# Patient Record
Sex: Female | Born: 1955 | Race: Black or African American | Hispanic: No | Marital: Married | Smoking: Never smoker
Health system: Southern US, Community
[De-identification: ages and names within clinical notes are randomized; demographics above are authoritative.]

## PROBLEM LIST (undated history)

## (undated) DIAGNOSIS — I10 Essential (primary) hypertension: Secondary | ICD-10-CM

## (undated) DIAGNOSIS — E559 Vitamin D deficiency, unspecified: Secondary | ICD-10-CM

## (undated) DIAGNOSIS — L80 Vitiligo: Secondary | ICD-10-CM

## (undated) DIAGNOSIS — K22711 Barrett's esophagus with high grade dysplasia: Secondary | ICD-10-CM

## (undated) DIAGNOSIS — K8020 Calculus of gallbladder without cholecystitis without obstruction: Secondary | ICD-10-CM

## (undated) DIAGNOSIS — K219 Gastro-esophageal reflux disease without esophagitis: Secondary | ICD-10-CM

## (undated) DIAGNOSIS — K589 Irritable bowel syndrome without diarrhea: Secondary | ICD-10-CM

## (undated) DIAGNOSIS — E785 Hyperlipidemia, unspecified: Secondary | ICD-10-CM

## (undated) DIAGNOSIS — A599 Trichomoniasis, unspecified: Secondary | ICD-10-CM

## (undated) DIAGNOSIS — G5600 Carpal tunnel syndrome, unspecified upper limb: Secondary | ICD-10-CM

## (undated) DIAGNOSIS — A749 Chlamydial infection, unspecified: Secondary | ICD-10-CM

## (undated) DIAGNOSIS — A6000 Herpesviral infection of urogenital system, unspecified: Secondary | ICD-10-CM

## (undated) DIAGNOSIS — L309 Dermatitis, unspecified: Secondary | ICD-10-CM

## (undated) DIAGNOSIS — E119 Type 2 diabetes mellitus without complications: Secondary | ICD-10-CM

## (undated) HISTORY — PX: BREAST SURGERY: SHX581

## (undated) HISTORY — PX: CHOLECYSTECTOMY: SHX55

## (undated) HISTORY — PX: ABDOMINAL HYSTERECTOMY: SHX81

## (undated) HISTORY — PX: TONSILLECTOMY: SUR1361

---

## 2004-11-19 ENCOUNTER — Ambulatory Visit: Payer: Self-pay | Admitting: Internal Medicine

## 2004-12-09 ENCOUNTER — Ambulatory Visit: Payer: Self-pay | Admitting: Internal Medicine

## 2005-06-30 ENCOUNTER — Emergency Department: Payer: Self-pay | Admitting: Emergency Medicine

## 2005-06-30 ENCOUNTER — Other Ambulatory Visit: Payer: Self-pay

## 2005-07-01 ENCOUNTER — Ambulatory Visit: Payer: Self-pay | Admitting: Emergency Medicine

## 2005-11-06 ENCOUNTER — Ambulatory Visit: Payer: Self-pay | Admitting: Unknown Physician Specialty

## 2006-03-10 ENCOUNTER — Ambulatory Visit: Payer: Self-pay | Admitting: Gastroenterology

## 2008-07-27 ENCOUNTER — Ambulatory Visit: Payer: Self-pay | Admitting: Unknown Physician Specialty

## 2009-05-15 ENCOUNTER — Ambulatory Visit: Payer: Self-pay | Admitting: Nurse Practitioner

## 2009-10-01 ENCOUNTER — Ambulatory Visit: Payer: Self-pay | Admitting: Unknown Physician Specialty

## 2009-11-29 ENCOUNTER — Ambulatory Visit: Payer: Self-pay | Admitting: Gastroenterology

## 2010-06-07 ENCOUNTER — Ambulatory Visit: Payer: Self-pay | Admitting: Internal Medicine

## 2010-07-01 ENCOUNTER — Ambulatory Visit: Payer: Self-pay | Admitting: Family Medicine

## 2010-07-02 ENCOUNTER — Ambulatory Visit: Payer: Self-pay | Admitting: Family Medicine

## 2010-07-31 ENCOUNTER — Ambulatory Visit: Payer: Self-pay | Admitting: Family Medicine

## 2010-08-31 ENCOUNTER — Ambulatory Visit: Payer: Self-pay | Admitting: Family Medicine

## 2010-11-07 ENCOUNTER — Ambulatory Visit: Payer: Self-pay | Admitting: Unknown Physician Specialty

## 2012-04-17 IMAGING — US US PELV - US TRANSVAGINAL
1 series · 17 of 25 positions shown · non-contrast
Comparison: none

REASON FOR EXAM: PELVIC BLOATING NEG ABD US ON October 01, 2009
COMMENTS:

[Series 1: us pelv - us transvaginal · 17 of 41 slices shown]
[im 1/41]
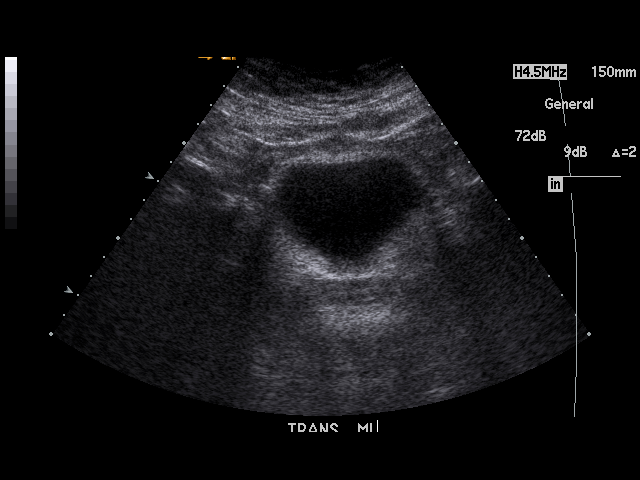
[im 4/41]
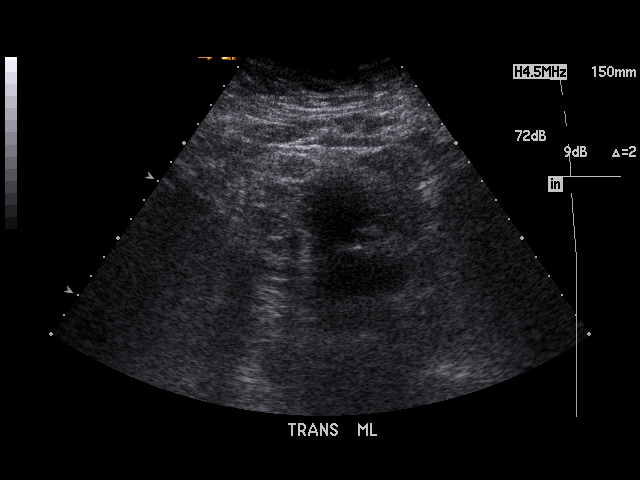
[im 6/41]
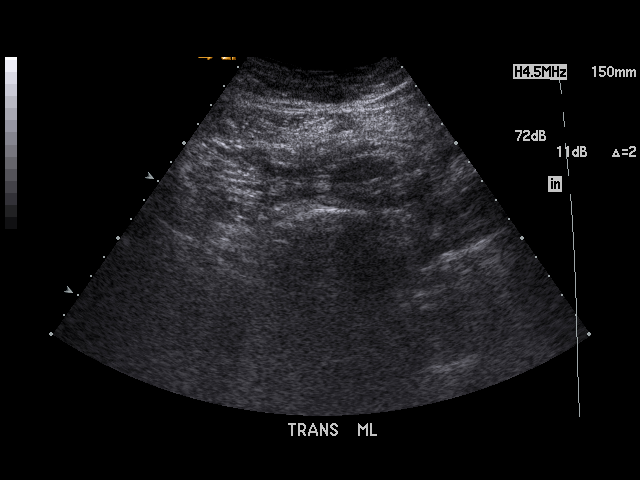
[im 9/41]
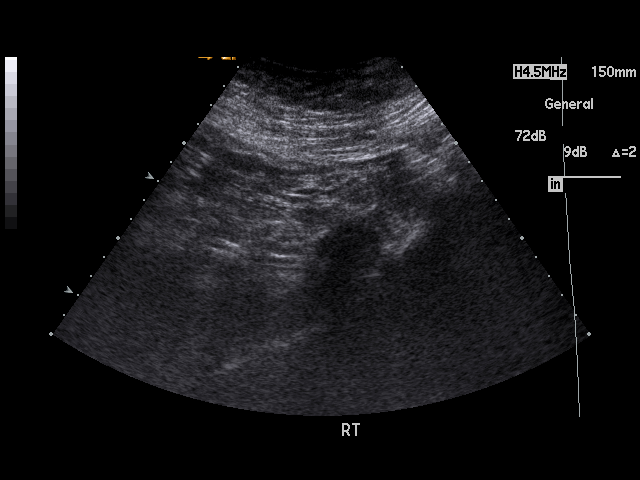
[im 11/41]
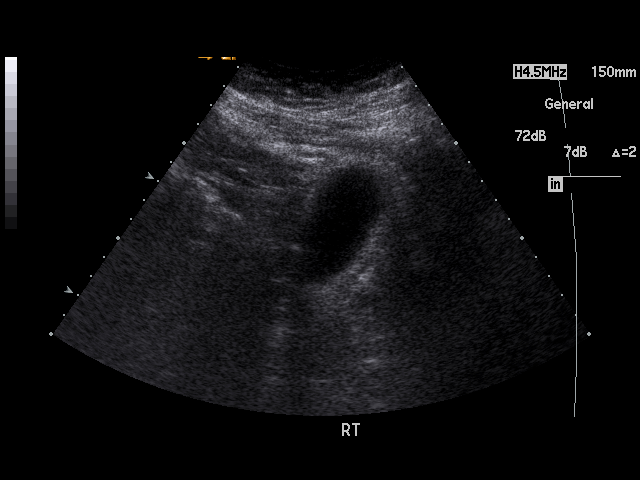
[im 14/41]
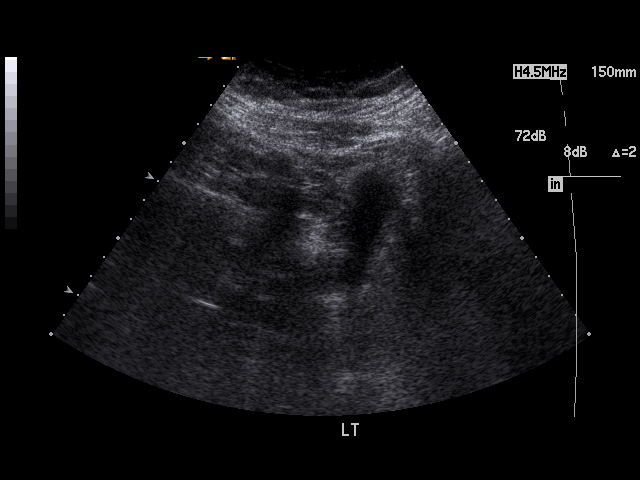
[im 16/41]
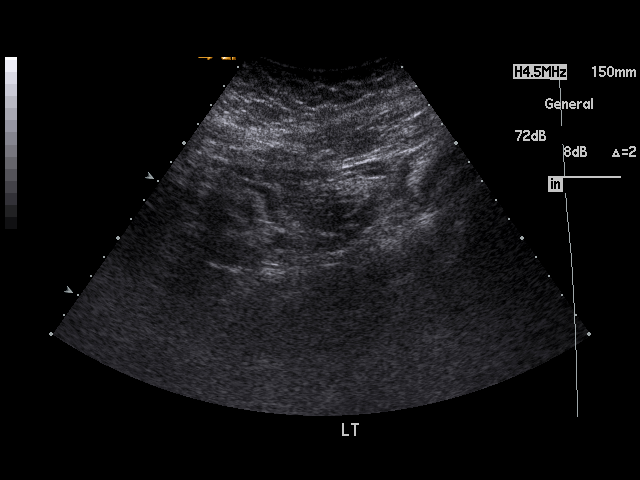
[im 19/41]
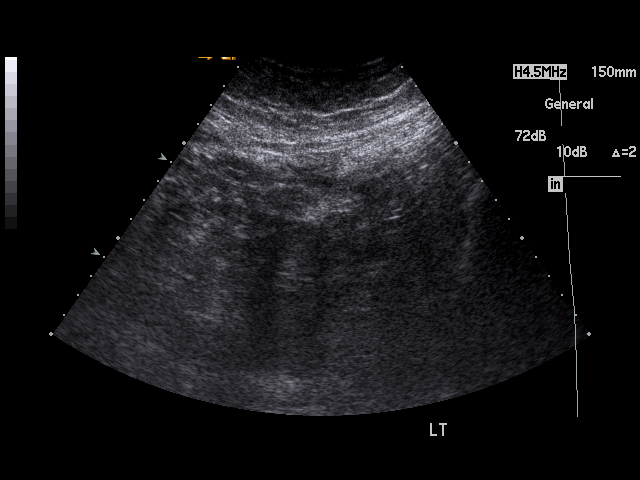
[im 21/41]
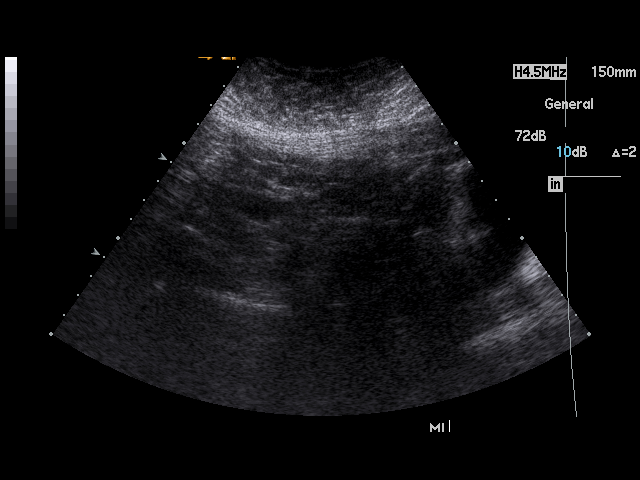
[im 22/41]
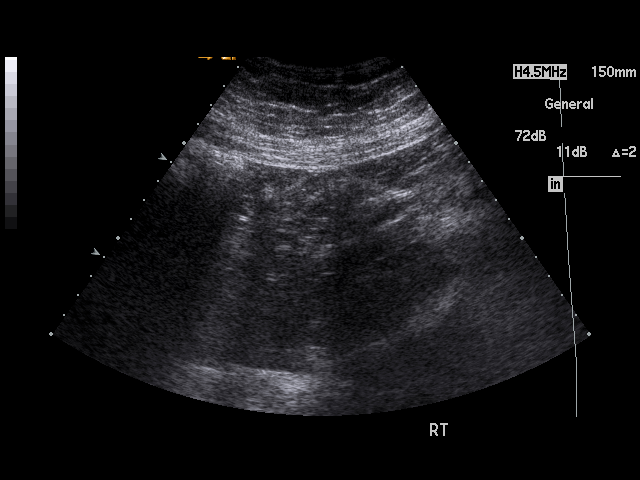
[im 26/41]
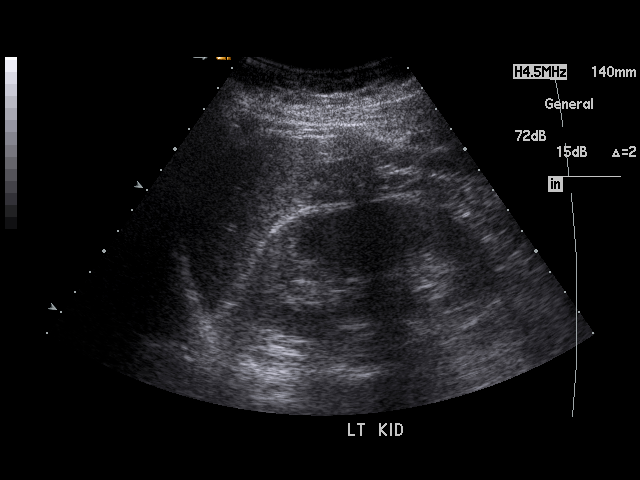
[im 27/41]
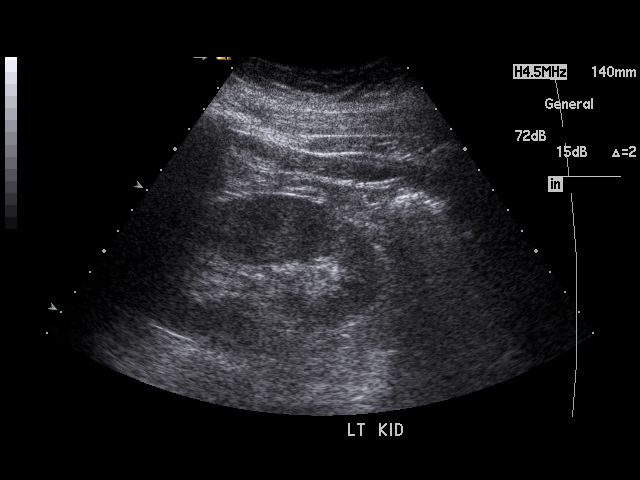
[im 31/41]
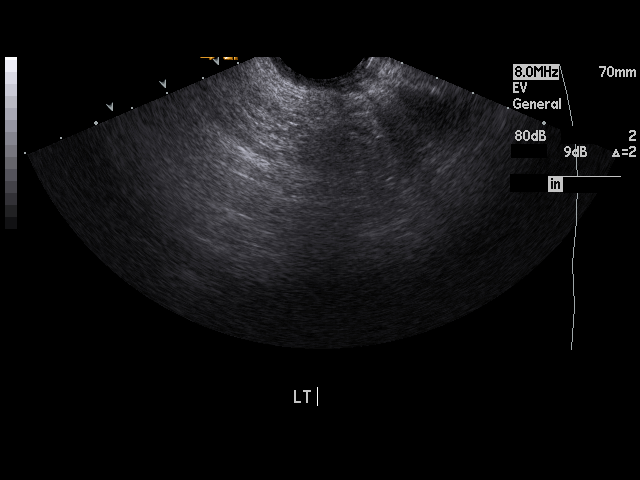
[im 32/41]
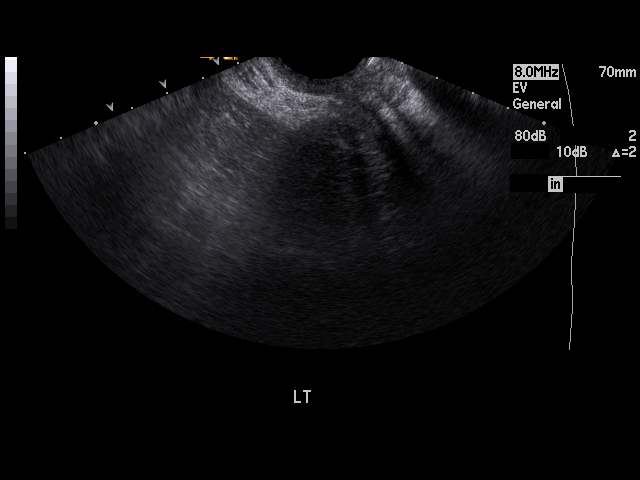
[im 36/41]
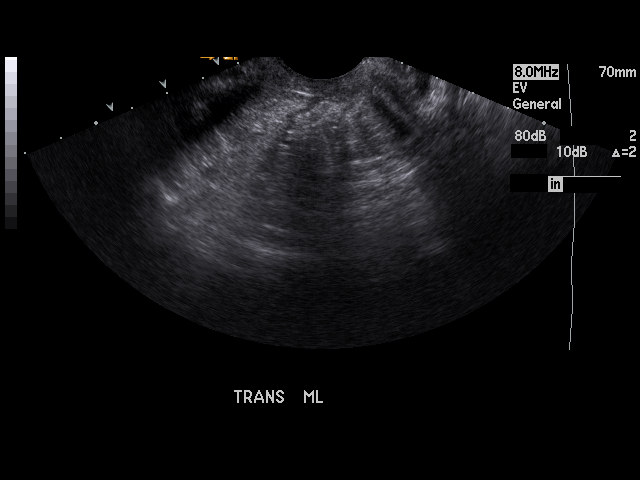
[im 37/41]
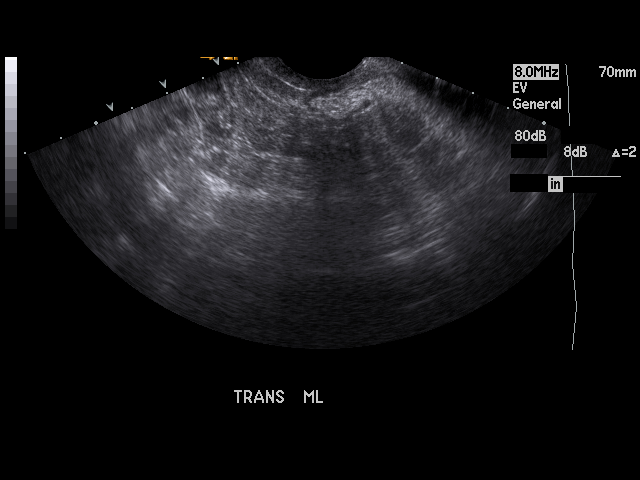
[im 41/41]
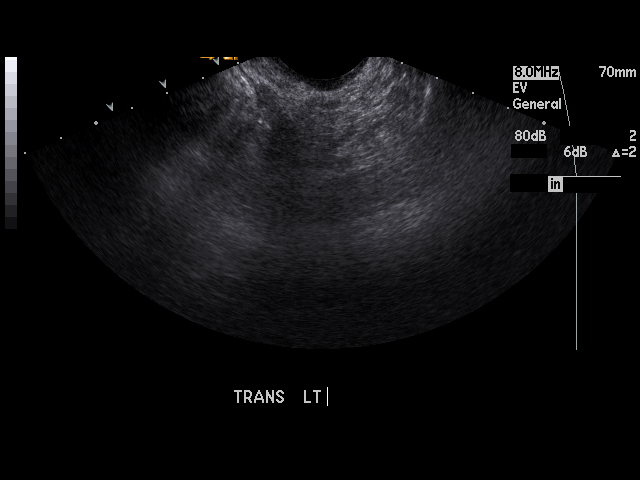

[17 of 25 positions shown; findings below may reference images not displayed]

PROCEDURE:     US  - US PELVIS EXAM W/TRANSVAGINAL  - November 29, 2009  [DATE]

RESULT:     The patient has undergone prior hysterectomy. There is no free
fluid in the pelvis. The ovaries are not identified. No cystic or solid
pelvic masses are identified. Survey views of the kidneys reveal no
hydronephrosis.
IMPRESSION: I do not see abnormalities within the pelvis. The uterus is
surgically absent and the ovaries are not identified.

## 2012-10-24 IMAGING — CT CT HEAD WITHOUT CONTRAST
2 series · 16 of 30 positions shown, 20 images · non-contrast
Comparison: none

REASON FOR EXAM: severe headache hx htn,dm
COMMENTS:

PROCEDURE:     CT  - CT HEAD WITHOUT CONTRAST  - June 07, 2010  [DATE]
RESULT:     History: Headache.
Comparison Study: None.

[Series 2: without · axial · non-contrast · 0.43mm/px · z∈[+199,+319]mm · 13 of 30 slices shown, 17 images]
[im 3/30  brain]
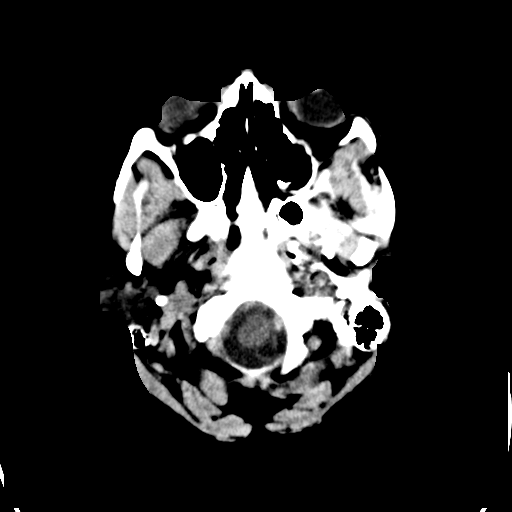
[im 3/30  bone]
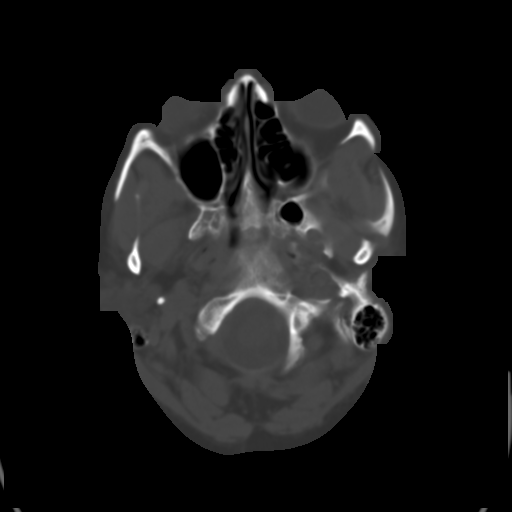
[im 5/30  brain]
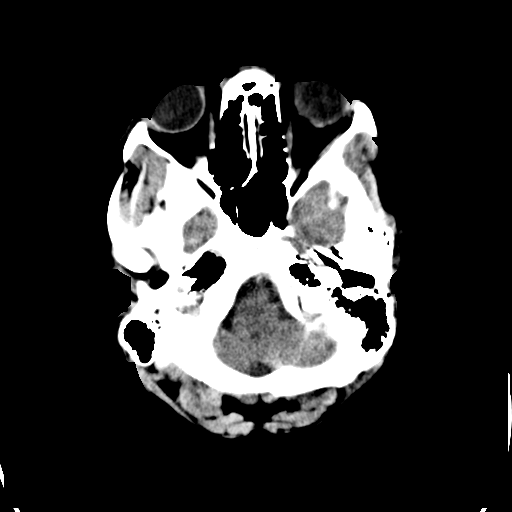
[im 7/30  brain]
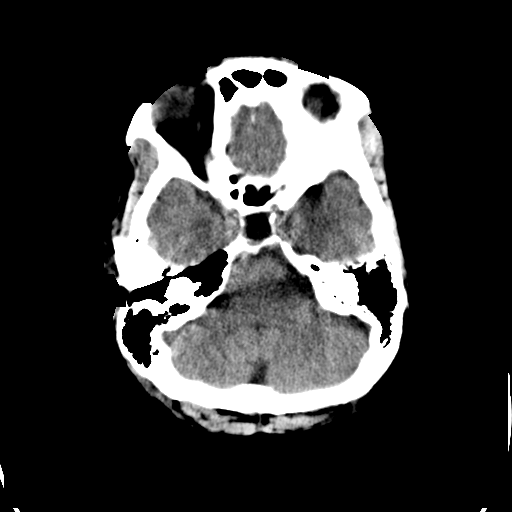
[im 9/30  brain]
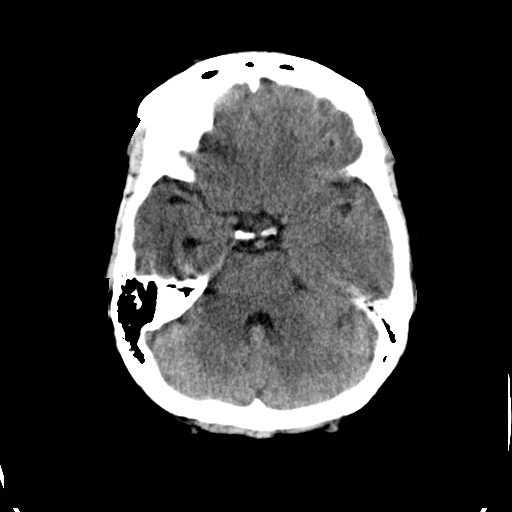
[im 11/30  brain]
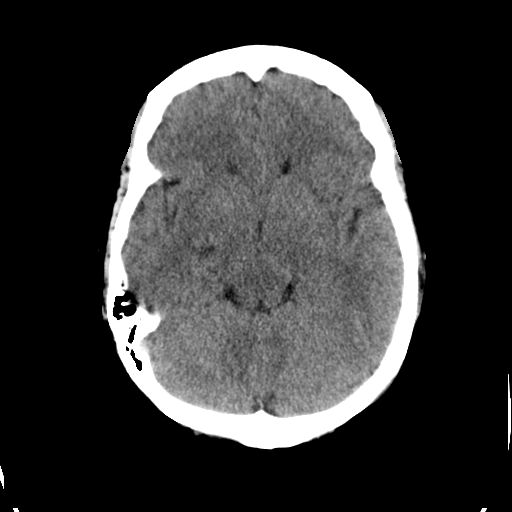
[im 11/30  bone]
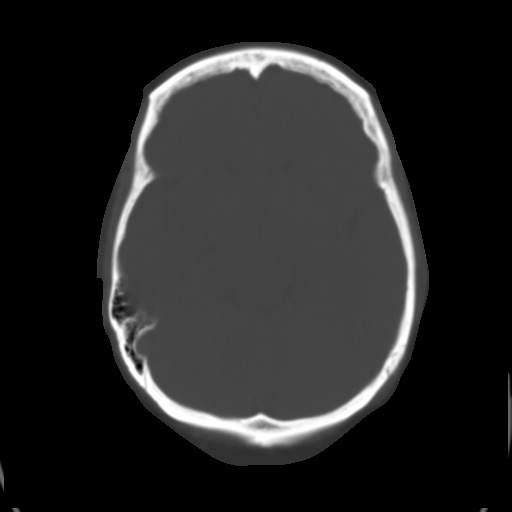
[im 13/30  brain]
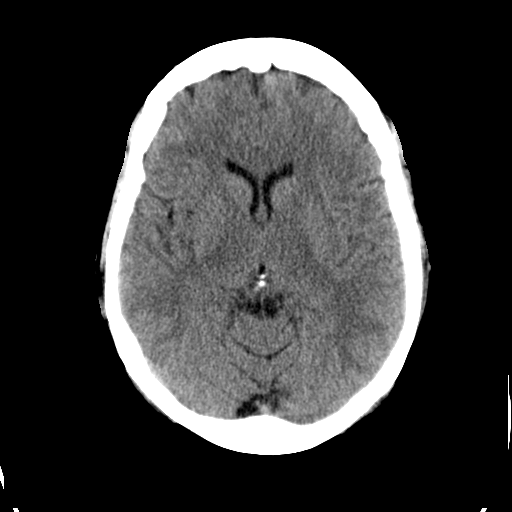
[im 15/30  brain]
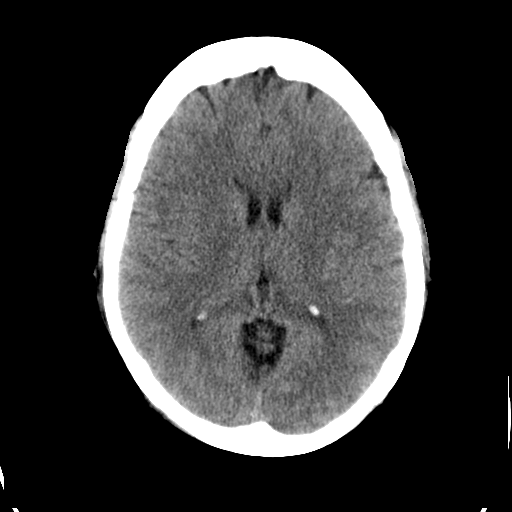
[im 17/30  brain]
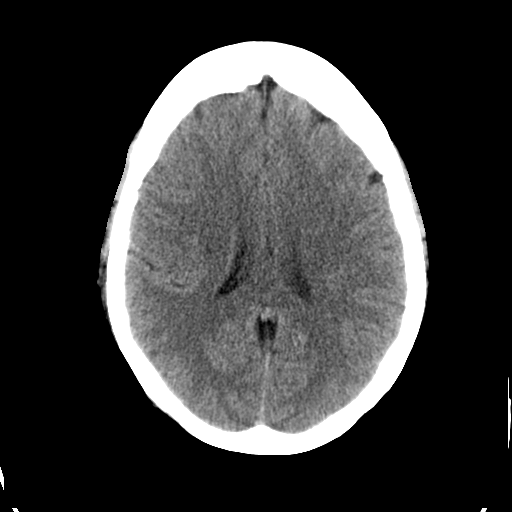
[im 19/30  brain]
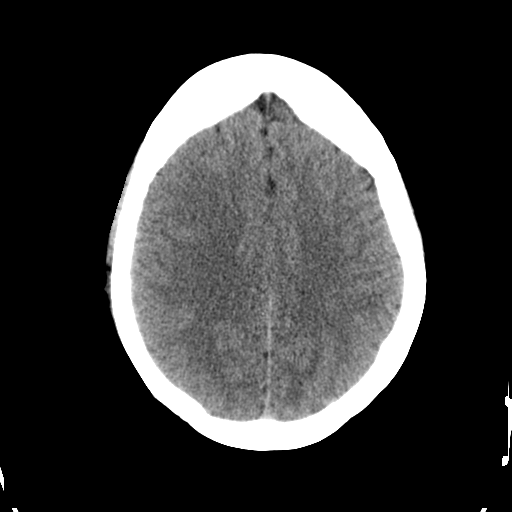
[im 19/30  bone]
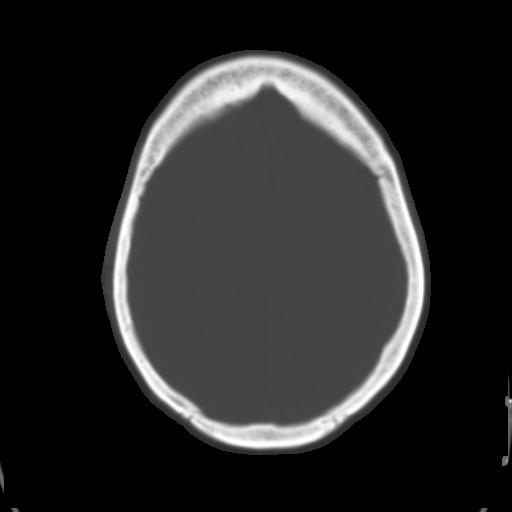
[im 21/30  brain]
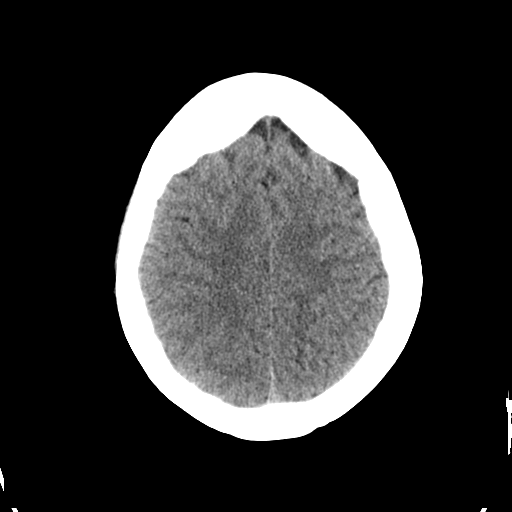
[im 23/30  brain]
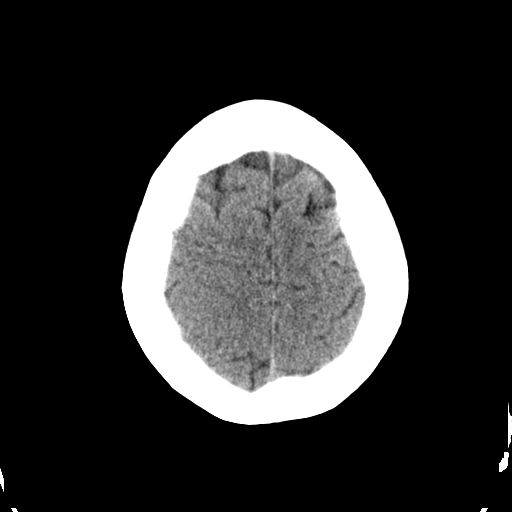
[im 25/30  brain]
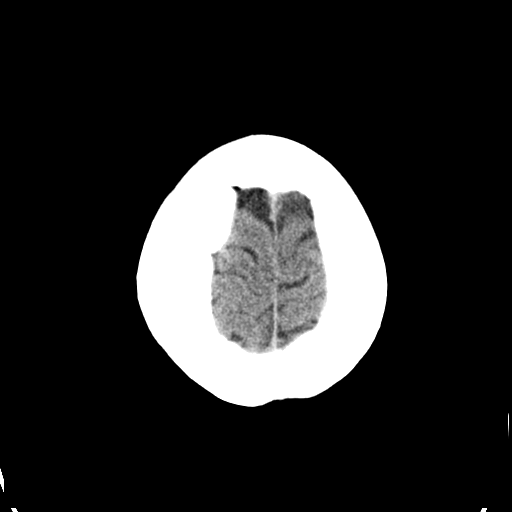
[im 27/30  brain]
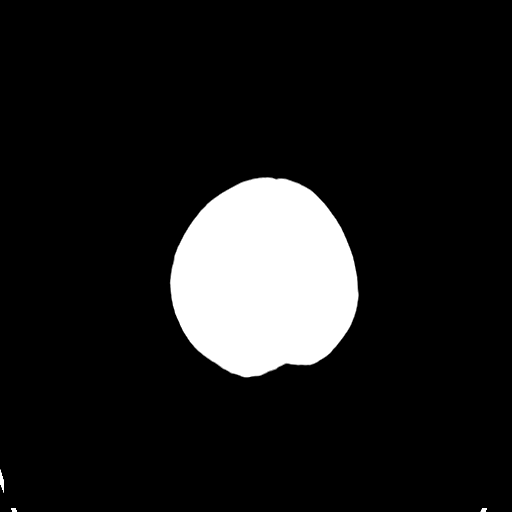
[im 27/30  bone]
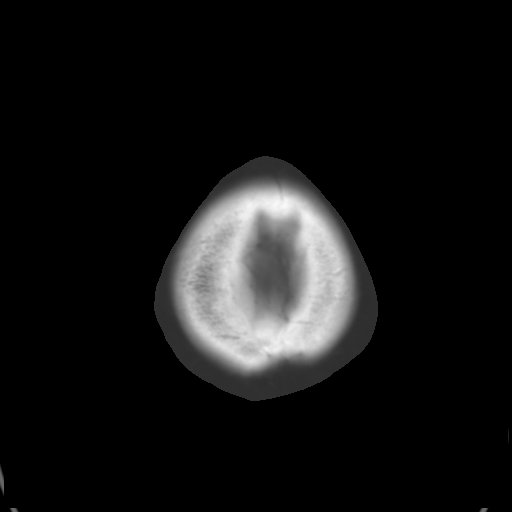

[Series 3: bone · axial · 0.43mm/px · z∈[+199,+239]mm · 3 of 30 slices shown]
[im 3/30  bone]
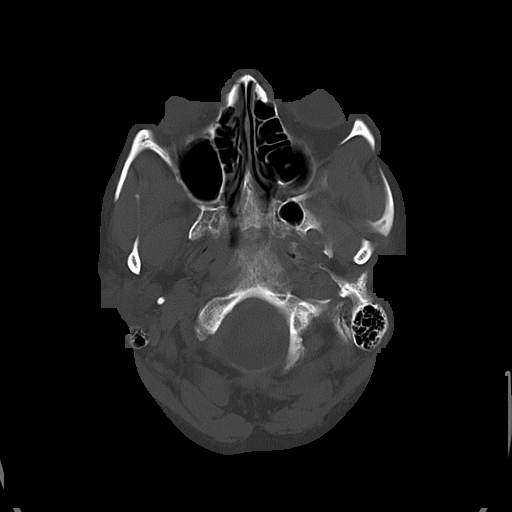
[im 7/30  bone]
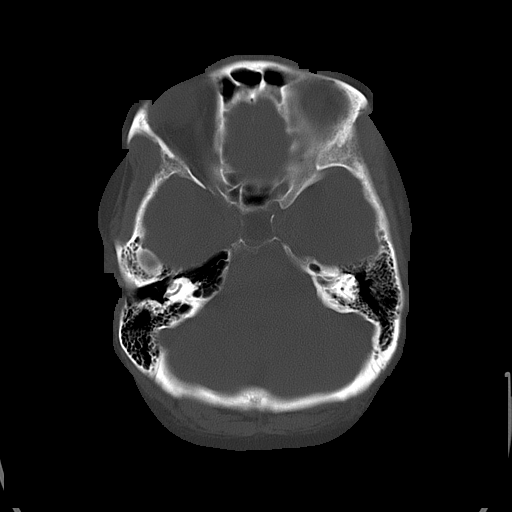
[im 11/30  bone]
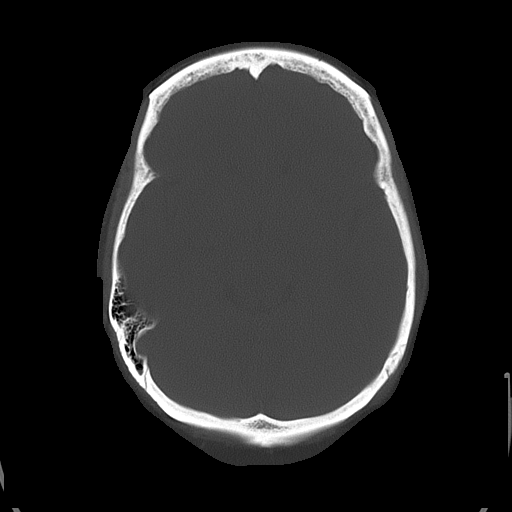

[16 of 30 positions shown; findings below may reference images not displayed]

FINDINGS: No mass. No hydrocephalus. No hemorrhage. No hydrocephalus. No
acute bony abnormality.
IMPRESSION: No acute abnormality.

## 2015-08-09 ENCOUNTER — Other Ambulatory Visit: Payer: Self-pay | Admitting: Nurse Practitioner

## 2015-08-09 DIAGNOSIS — N6320 Unspecified lump in the left breast, unspecified quadrant: Secondary | ICD-10-CM

## 2015-08-14 ENCOUNTER — Ambulatory Visit
Admission: RE | Admit: 2015-08-14 | Discharge: 2015-08-14 | Disposition: A | Discharge status: Home / Self Care | Payer: Managed Care, Other (non HMO) | Source: Ambulatory Visit | Attending: Nurse Practitioner | Admitting: Nurse Practitioner

## 2015-08-14 DIAGNOSIS — N6320 Unspecified lump in the left breast, unspecified quadrant: Secondary | ICD-10-CM

## 2015-08-14 DIAGNOSIS — N63 Unspecified lump in breast: Secondary | ICD-10-CM | POA: Diagnosis present

## 2015-08-15 LAB — SURGICAL PATHOLOGY

## 2015-08-22 ENCOUNTER — Other Ambulatory Visit: Payer: Self-pay | Admitting: Physician Assistant

## 2015-08-22 DIAGNOSIS — R1011 Right upper quadrant pain: Secondary | ICD-10-CM

## 2015-08-28 ENCOUNTER — Ambulatory Visit
Admission: RE | Admit: 2015-08-28 | Discharge: 2015-08-28 | Disposition: A | Discharge status: Home / Self Care | Payer: Managed Care, Other (non HMO) | Source: Ambulatory Visit | Attending: Physician Assistant | Admitting: Physician Assistant

## 2015-08-28 DIAGNOSIS — R1011 Right upper quadrant pain: Secondary | ICD-10-CM | POA: Diagnosis present

## 2015-08-28 DIAGNOSIS — Z9049 Acquired absence of other specified parts of digestive tract: Secondary | ICD-10-CM | POA: Insufficient documentation

## 2015-09-20 ENCOUNTER — Encounter: Payer: Self-pay | Admitting: *Deleted

## 2015-09-23 ENCOUNTER — Ambulatory Visit: Payer: Managed Care, Other (non HMO) | Admitting: Anesthesiology

## 2015-09-23 ENCOUNTER — Encounter
Admission: RE | Disposition: A | Discharge status: Home / Self Care | Payer: Self-pay | Source: Ambulatory Visit | Attending: Unknown Physician Specialty

## 2015-09-23 ENCOUNTER — Ambulatory Visit
Admission: RE | Admit: 2015-09-23 | Discharge: 2015-09-23 | Disposition: A | Discharge status: Home / Self Care | Payer: Managed Care, Other (non HMO) | Source: Ambulatory Visit | Attending: Unknown Physician Specialty | Admitting: Unknown Physician Specialty

## 2015-09-23 DIAGNOSIS — I10 Essential (primary) hypertension: Secondary | ICD-10-CM | POA: Insufficient documentation

## 2015-09-23 DIAGNOSIS — E119 Type 2 diabetes mellitus without complications: Secondary | ICD-10-CM | POA: Insufficient documentation

## 2015-09-23 DIAGNOSIS — D123 Benign neoplasm of transverse colon: Secondary | ICD-10-CM | POA: Insufficient documentation

## 2015-09-23 DIAGNOSIS — K648 Other hemorrhoids: Secondary | ICD-10-CM | POA: Insufficient documentation

## 2015-09-23 DIAGNOSIS — Z79899 Other long term (current) drug therapy: Secondary | ICD-10-CM | POA: Diagnosis not present

## 2015-09-23 DIAGNOSIS — Z7984 Long term (current) use of oral hypoglycemic drugs: Secondary | ICD-10-CM | POA: Diagnosis not present

## 2015-09-23 DIAGNOSIS — K219 Gastro-esophageal reflux disease without esophagitis: Secondary | ICD-10-CM | POA: Insufficient documentation

## 2015-09-23 DIAGNOSIS — Z7982 Long term (current) use of aspirin: Secondary | ICD-10-CM | POA: Diagnosis not present

## 2015-09-23 DIAGNOSIS — Z1211 Encounter for screening for malignant neoplasm of colon: Secondary | ICD-10-CM | POA: Insufficient documentation

## 2015-09-23 DIAGNOSIS — E785 Hyperlipidemia, unspecified: Secondary | ICD-10-CM | POA: Insufficient documentation

## 2015-09-23 DIAGNOSIS — Z8371 Family history of colonic polyps: Secondary | ICD-10-CM | POA: Insufficient documentation

## 2015-09-23 DIAGNOSIS — K5730 Diverticulosis of large intestine without perforation or abscess without bleeding: Secondary | ICD-10-CM | POA: Diagnosis not present

## 2015-09-23 HISTORY — DX: Barrett's esophagus with high grade dysplasia: K22.711

## 2015-09-23 HISTORY — DX: Irritable bowel syndrome, unspecified: K58.9

## 2015-09-23 HISTORY — DX: Trichomoniasis, unspecified: A59.9

## 2015-09-23 HISTORY — DX: Carpal tunnel syndrome, unspecified upper limb: G56.00

## 2015-09-23 HISTORY — DX: Calculus of gallbladder without cholecystitis without obstruction: K80.20

## 2015-09-23 HISTORY — DX: Hyperlipidemia, unspecified: E78.5

## 2015-09-23 HISTORY — DX: Herpesviral infection of urogenital system, unspecified: A60.00

## 2015-09-23 HISTORY — DX: Irritable bowel syndrome without diarrhea: K58.9

## 2015-09-23 HISTORY — DX: Type 2 diabetes mellitus without complications: E11.9

## 2015-09-23 HISTORY — DX: Essential (primary) hypertension: I10

## 2015-09-23 HISTORY — PX: COLONOSCOPY WITH PROPOFOL: SHX5780

## 2015-09-23 HISTORY — DX: Gastro-esophageal reflux disease without esophagitis: K21.9

## 2015-09-23 HISTORY — DX: Vitiligo: L80

## 2015-09-23 HISTORY — DX: Vitamin D deficiency, unspecified: E55.9

## 2015-09-23 HISTORY — DX: Chlamydial infection, unspecified: A74.9

## 2015-09-23 HISTORY — DX: Dermatitis, unspecified: L30.9

## 2015-09-23 LAB — GLUCOSE, CAPILLARY: Glucose-Capillary: 122 mg/dL — ABNORMAL HIGH (ref 65–99)

## 2015-09-23 SURGERY — COLONOSCOPY WITH PROPOFOL
Anesthesia: General

## 2015-09-23 MED ORDER — FENTANYL CITRATE (PF) 100 MCG/2ML IJ SOLN
INTRAMUSCULAR | Status: DC | PRN
  Administered 2015-09-23: 50 ug via INTRAVENOUS

## 2015-09-23 MED ORDER — PROPOFOL 500 MG/50ML IV EMUL
INTRAVENOUS | Status: DC | PRN
  Administered 2015-09-23: 120 ug/kg/min via INTRAVENOUS

## 2015-09-23 MED ORDER — SODIUM CHLORIDE 0.9 % IV SOLN: INTRAVENOUS | Status: DC

## 2015-09-23 MED ORDER — SODIUM CHLORIDE 0.9 % IV SOLN
INTRAVENOUS | Status: DC
  Administered 2015-09-23: 1000 mL via INTRAVENOUS

## 2015-09-23 MED ORDER — EPHEDRINE SULFATE 50 MG/ML IJ SOLN
INTRAMUSCULAR | Status: DC | PRN
  Administered 2015-09-23: 5 mg via INTRAVENOUS

## 2015-09-23 MED ORDER — MIDAZOLAM HCL 2 MG/2ML IJ SOLN
INTRAMUSCULAR | Status: DC | PRN
  Administered 2015-09-23: 1 mg via INTRAVENOUS

## 2015-09-23 NOTE — Anesthesia Preprocedure Evaluation (Signed)
Anesthesia Evaluation  Patient identified by MRN, date of birth, ID band Patient awake    Reviewed: Allergy & Precautions, H&P , NPO status , Patient's Chart, lab work & pertinent test results  History of Anesthesia Complications Negative for: history of anesthetic complications  Airway Mallampati: III  TM Distance: >3 FB Neck ROM: limited    Dental  (+) Poor Dentition   Pulmonary neg pulmonary ROS, neg shortness of breath,    Pulmonary exam normal breath sounds clear to auscultation       Cardiovascular Exercise Tolerance: Good hypertension, (-) angina(-) Past MI and (-) DOE Normal cardiovascular exam Rhythm:regular Rate:Normal     Neuro/Psych  Neuromuscular disease negative psych ROS   GI/Hepatic negative GI ROS, Neg liver ROS, GERD  Controlled,  Endo/Other  diabetes, Type 2, Oral Hypoglycemic Agents  Renal/GU negative Renal ROS  negative genitourinary   Musculoskeletal   Abdominal   Peds  Hematology negative hematology ROS (+)   Anesthesia Other Findings Past Medical History:   Diabetes mellitus without complication (HCC)                 High grade dysplasia of Barrett's epithelium                 Carpal tunnel syndrome                                       Chlamydia                                                    Eczema                                                       Gall stones                                                  Genital herpes                                               GERD (gastroesophageal reflux disease)                       Hyperlipidemia                                               Hypertension                                                 IBS (irritable bowel syndrome)  Vitamin D deficiency                                         Vitiligo                                                     Trichimoniasis                                               Past Surgical History:   ABDOMINAL HYSTERECTOMY                                        CHOLECYSTECTOMY                                               TONSILLECTOMY                                                 BREAST SURGERY                                               BMI    Body Mass Index   34.59 kg/m 2      Reproductive/Obstetrics negative OB ROS                             Anesthesia Physical Anesthesia Plan  ASA: III  Anesthesia Plan: General   Post-op Pain Management:    Induction:   Airway Management Planned:   Additional Equipment:   Intra-op Plan:   Post-operative Plan:   Informed Consent: I have reviewed the patients History and Physical, chart, labs and discussed the procedure including the risks, benefits and alternatives for the proposed anesthesia with the patient or authorized representative who has indicated his/her understanding and acceptance.   Dental Advisory Given  Plan Discussed with: Anesthesiologist, CRNA and Surgeon  Anesthesia Plan Comments:         Anesthesia Quick Evaluation

## 2015-09-23 NOTE — Anesthesia Postprocedure Evaluation (Signed)
Anesthesia Post Note  Patient: Sandra Livingston  Procedure(s) Performed: Procedure(s) (LRB): COLONOSCOPY WITH PROPOFOL (N/A)  Patient location during evaluation: Endoscopy Anesthesia Type: General Level of consciousness: awake and alert Pain management: pain level controlled Vital Signs Assessment: post-procedure vital signs reviewed and stable Respiratory status: spontaneous breathing, nonlabored ventilation, respiratory function stable and patient connected to nasal cannula oxygen Cardiovascular status: blood pressure returned to baseline and stable Postop Assessment: no signs of nausea or vomiting Anesthetic complications: no    Last Vitals:  Filed Vitals:   09/23/15 0902 09/23/15 0910  BP:  107/75  Pulse: 79 77  Temp:    Resp: 23 17    Last Pain: There were no vitals filed for this visit.               Precious Haws Eathen Budreau

## 2015-09-23 NOTE — Op Note (Signed)
[facility] Gastroenterology Patient Name: Sandra Livingston Procedure Date: 09/23/2015 8:21 AM MRN: [id number] Account #: [id number] Date of Birth: [birth year] Admit Type: Outpatient Age: 60 Room: [facility] ENDO ROOM 1 Gender: Female Note Status: Finalized Procedure:            Colonoscopy Indications:          Colon cancer screening in patient at increased risk:                        Family history of 1st-degree relative with colon polyps Providers:            Manya Silvas, MD Referring MD:         Juluis Rainier (Referring MD) Medicines:            Propofol per Anesthesia Complications:        No immediate complications. Procedure:            Pre-Anesthesia Assessment:                       - After reviewing the risks and benefits, the patient                        was deemed in satisfactory condition to undergo the                        procedure.                       After obtaining informed consent, the colonoscope was                        passed under direct vision. Throughout the procedure,                        the patient's blood pressure, pulse, and oxygen                        saturations were monitored continuously. The                        Colonoscope was introduced through the anus and                        advanced to the the cecum, identified by appendiceal                        orifice and ileocecal valve. The colonoscopy was                        performed without difficulty. The patient tolerated the                        procedure well. The quality of the bowel preparation                        was good. Findings:      Many medium-mouthed diverticula were found in the sigmoid colon,       descending colon and transverse colon.      A diminutive polyp was found in the transverse colon. The polyp was  sessile. The polyp was removed with a jumbo cold forceps. Resection and       retrieval were complete.      Internal hemorrhoids  were found during endoscopy. The hemorrhoids were       medium-sized and Grade I (internal hemorrhoids that do not prolapse). Impression:           - Diverticulosis in the sigmoid colon, in the                        descending colon and in the transverse colon.                       - One diminutive polyp in the transverse colon, removed                        with a jumbo cold forceps. Resected and retrieved.                       - Internal hemorrhoids. Recommendation:       - Await pathology results. Manya Silvas, MD 09/23/2015 8:42:11 AM This report has been signed electronically. Number of Addenda: 0 Note Initiated On: 09/23/2015 8:21 AM Scope Withdrawal Time: 0 hours 6 minutes 13 seconds  Total Procedure Duration: 0 hours 10 minutes 47 seconds       [facility]

## 2015-09-23 NOTE — H&P (Signed)
   Primary Care Physician:  Sallee Lange, NP Primary Gastroenterologist:  Dr. Vira Agar  Pre-Procedure History & Physical: HPI:  Sandra Livingston is a 60 y.o. female is here for an colonoscopy.   Past Medical History  Diagnosis Date  . Diabetes mellitus without complication ([place])   . High grade dysplasia of Barrett's epithelium   . Carpal tunnel syndrome   . Chlamydia   . Eczema   . Gall stones   . Genital herpes   . GERD (gastroesophageal reflux disease)   . Hyperlipidemia   . Hypertension   . IBS (irritable bowel syndrome)   . Vitamin D deficiency   . Vitiligo   . Trichimoniasis     Past Surgical History  Procedure Laterality Date  . Abdominal hysterectomy    . Cholecystectomy    . Tonsillectomy    . Breast surgery      Prior to Admission medications   Medication Sig Start Date End Date Taking? Authorizing Provider  aspirin EC 81 MG tablet Take 81 mg by mouth daily.   Yes Historical Provider, MD  hydrochlorothiazide (HYDRODIURIL) 25 MG tablet Take 25 mg by mouth daily.   Yes Historical Provider, MD  LACTOBACILLUS ACID-PECTIN PO Take 1 tablet by mouth daily.   Yes Historical Provider, MD  lisinopril (PRINIVIL,ZESTRIL) 40 MG tablet Take 40 mg by mouth daily.   Yes Historical Provider, MD  metFORMIN (GLUCOPHAGE) 500 MG tablet Take by mouth daily with breakfast.   Yes Historical Provider, MD  pantoprazole (PROTONIX) 40 MG tablet Take 40 mg by mouth 2 (two) times daily.   Yes Historical Provider, MD  pimecrolimus (ELIDEL) 1 % cream Apply 1 application topically 2 (two) times daily.   Yes Historical Provider, MD    Allergies as of 08/30/2015  . (Not on File)    History reviewed. No pertinent family history.  Social History   Social History  . Marital Status: Divorced    Spouse Name: N/A  . Number of Children: N/A  . Years of Education: N/A   Occupational History  . Not on file.   Social History Main Topics  . Smoking status: Never Smoker   . Smokeless  tobacco: Never Used  . Alcohol Use: No  . Drug Use: No  . Sexual Activity: Not on file   Other Topics Concern  . Not on file   Social History Narrative    Review of Systems: See HPI, otherwise negative ROS  Physical Exam: BP 113/77 mmHg  Pulse 93  Temp(Src) 96.1 F (35.6 C) (Tympanic)  Resp 18  Ht 5' 2.5" (1.588 m)  Wt 87.232 kg (192 lb 5 oz)  BMI 34.59 kg/m2  SpO2 100% General:   Alert,  pleasant and cooperative in NAD Head:  Normocephalic and atraumatic. Neck:  Supple; no masses or thyromegaly. Lungs:  Clear throughout to auscultation.    Heart:  Regular rate and rhythm. Abdomen:  Soft, nontender and nondistended. Normal bowel sounds, without guarding, and without rebound.   Neurologic:  Alert and  oriented x4;  grossly normal neurologically.  Impression/Plan: [name] is here for an colonoscopy to be performed for FH colon polyps  Risks, benefits, limitations, and alternatives regarding  colonoscopy have been reviewed with the patient.  Questions have been answered.  All parties agreeable.   Gaylyn Cheers, MD  09/23/2015, 8:22 AM

## 2015-09-23 NOTE — Transfer of Care (Signed)
Immediate Anesthesia Transfer of Care Note  Patient: Sandra Livingston  Procedure(s) Performed: Procedure(s): COLONOSCOPY WITH PROPOFOL (N/A)  Patient Location: PACU  Anesthesia Type:General  Level of Consciousness: awake, alert , oriented and sedated  Airway & Oxygen Therapy: Patient Spontanous Breathing and Patient connected to nasal cannula oxygen  Post-op Assessment: Report given to RN and Post -op Vital signs reviewed and stable  Post vital signs: Reviewed and stable  Last Vitals:  Filed Vitals:   09/23/15 0843 09/23/15 0844  BP: 74/54 74/54  Pulse: 87 89  Temp: 36.2 C   Resp: 18 21    Complications: No apparent anesthesia complications

## 2015-09-23 NOTE — Anesthesia Postprocedure Evaluation (Incomplete)
Anesthesia Post Note  Patient: Sandra Livingston  Procedure(s) Performed: Procedure(s) (LRB): COLONOSCOPY WITH PROPOFOL (N/A)  Anesthesia Post Evaluation  Last Vitals:  Filed Vitals:   09/23/15 0843 09/23/15 0844  BP: 74/54 74/54  Pulse: 87 89  Temp: 36.2 C   Resp: 18 21    Last Pain: There were no vitals filed for this visit.               COOK-MARTIN,Ridley Dileo

## 2015-09-24 LAB — SURGICAL PATHOLOGY

## 2015-09-25 ENCOUNTER — Encounter: Payer: Self-pay | Admitting: Unknown Physician Specialty

## 2016-09-18 IMAGING — US US ABDOMEN LIMITED
1 series · 14 of 25 positions shown · non-contrast
Comparison: 10/01/2009

CLINICAL DATA: Right upper quadrant pain for 1 month with
increasing severity

EXAM:
US ABDOMEN LIMITED - RIGHT UPPER QUADRANT

[Series 1: us abdomen limited · 0.22mm/px · 14 of 35 slices shown]
[im 1/35]
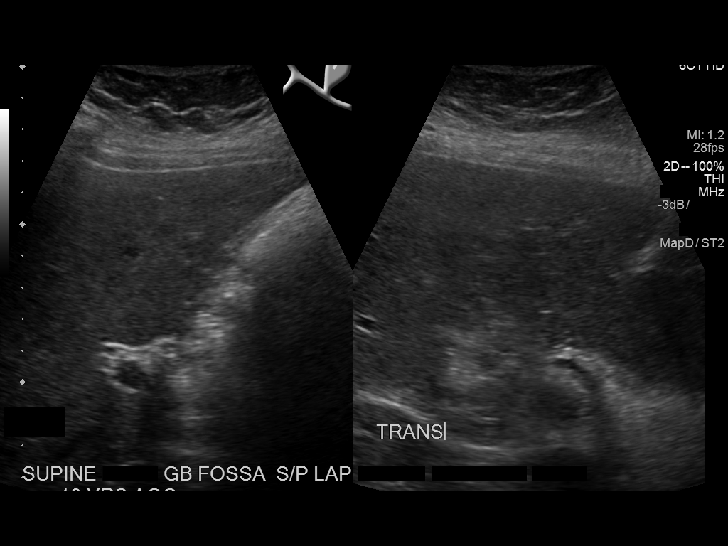
[im 3/35]
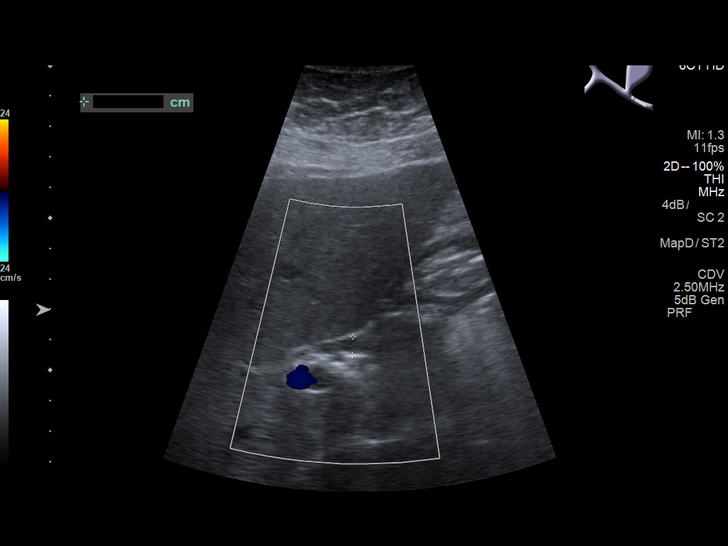
[im 6/35]
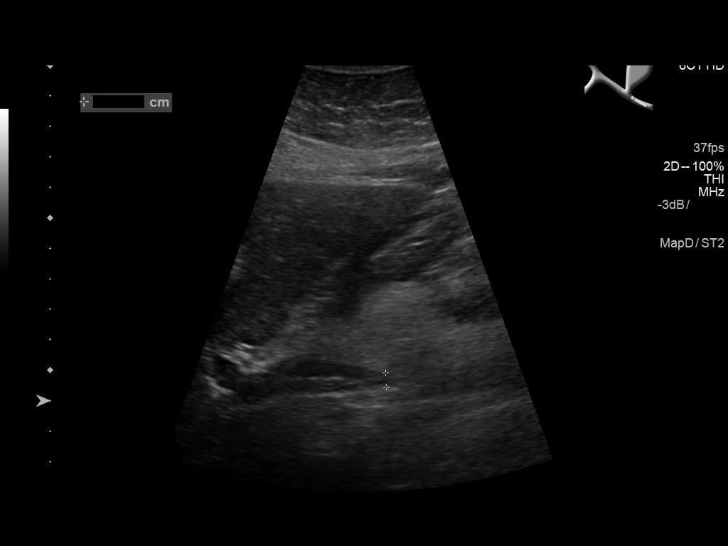
[im 9/35]
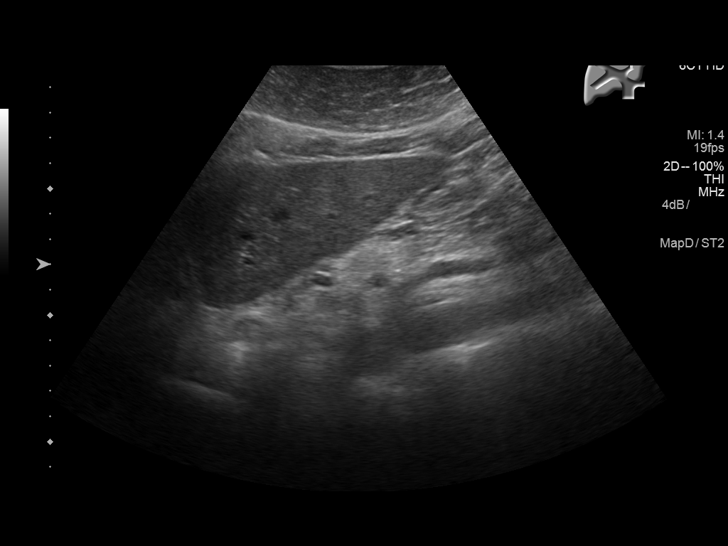
[im 12/35]
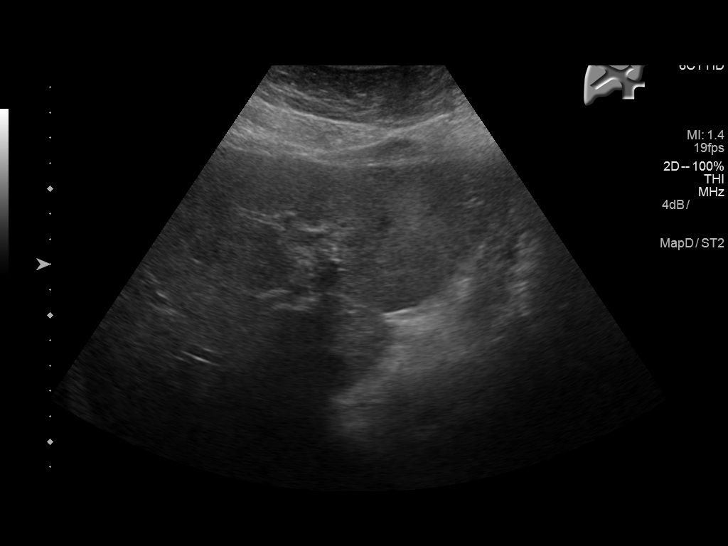
[im 13/35]
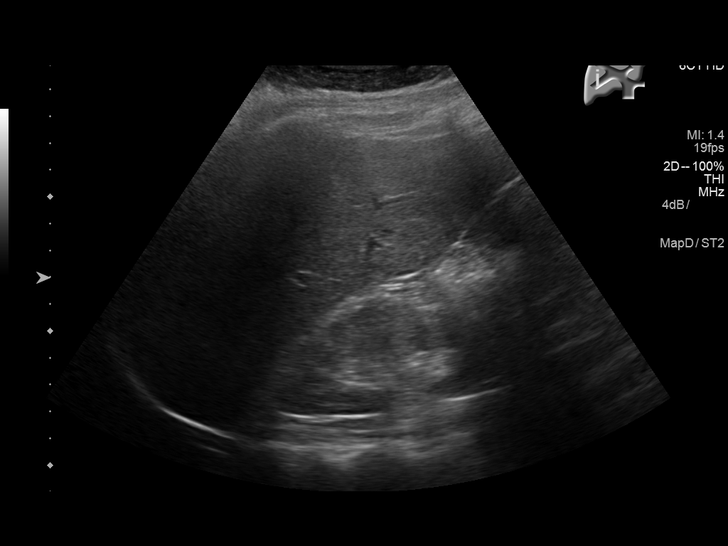
[im 16/35]
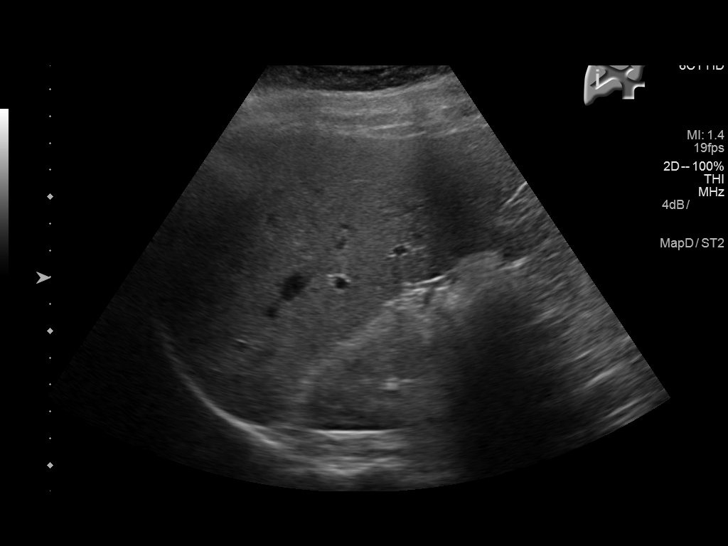
[im 19/35]
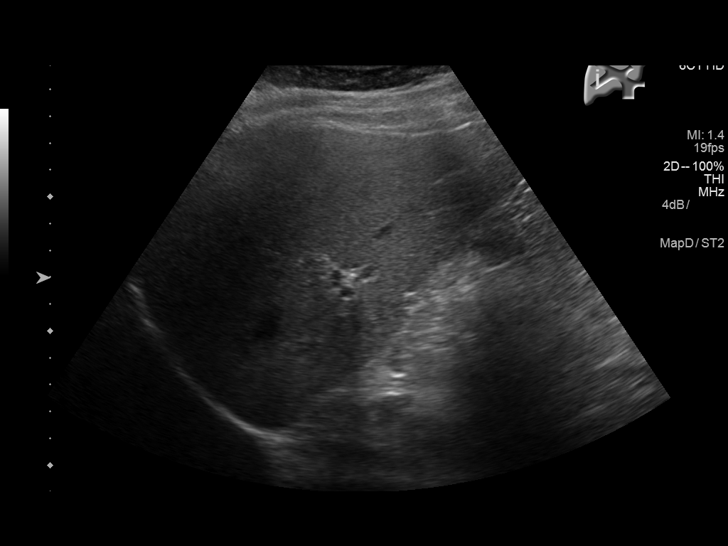
[im 22/35]
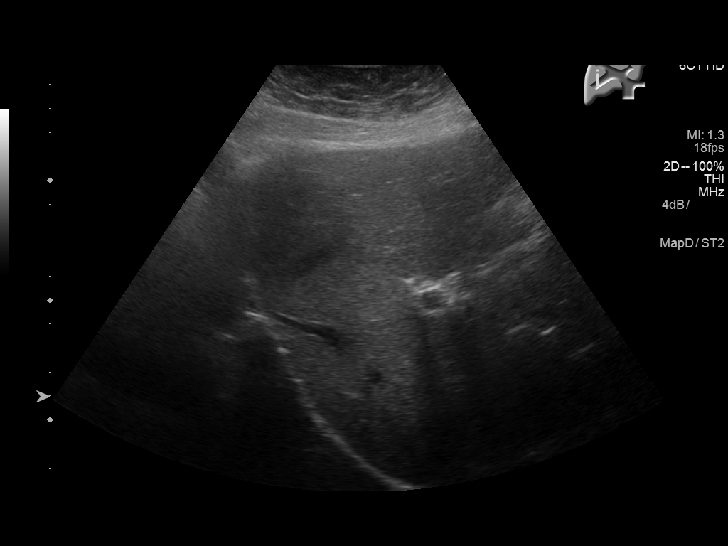
[im 23/35]
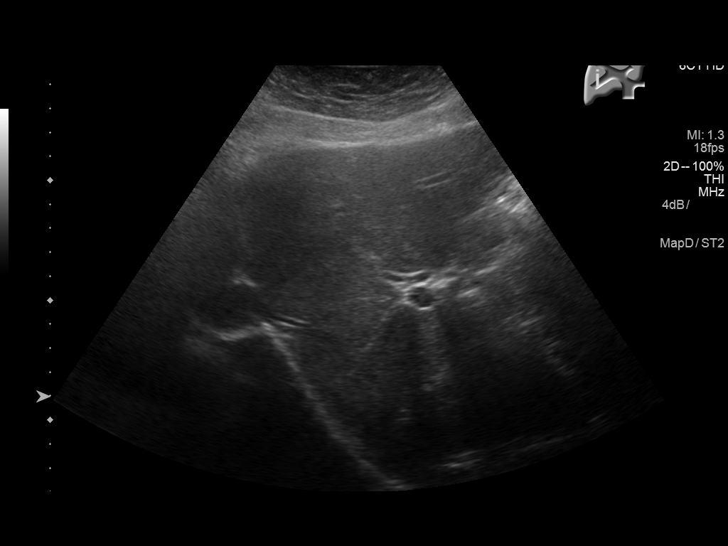
[im 26/35]
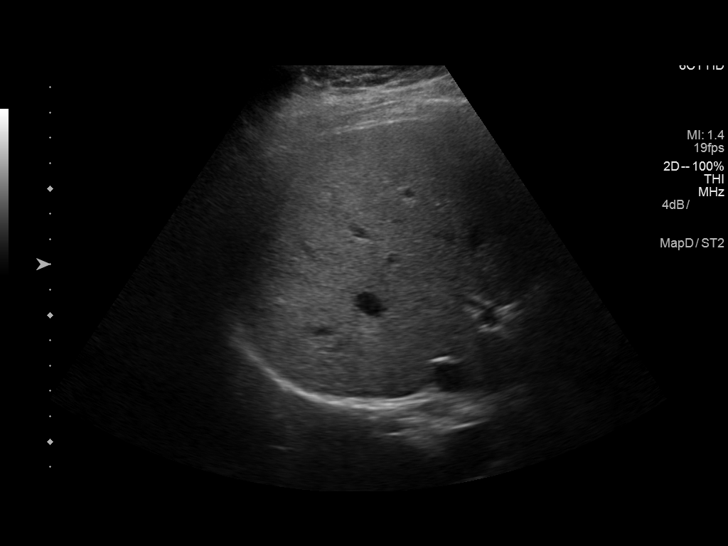
[im 29/35]
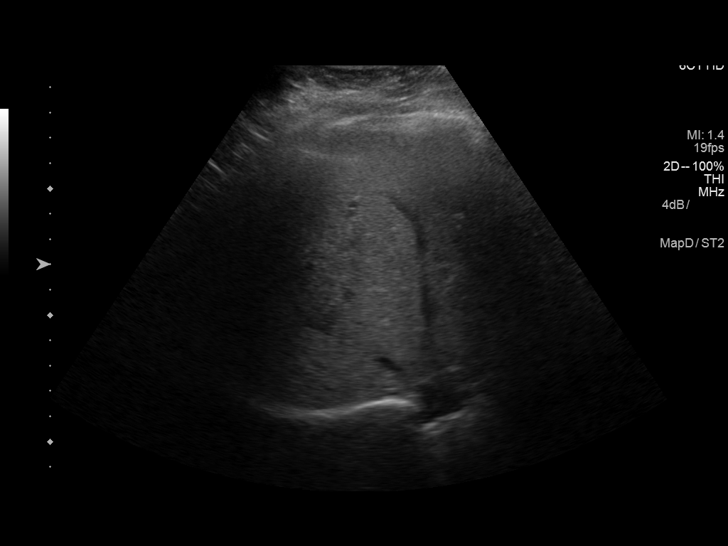
[im 32/35]
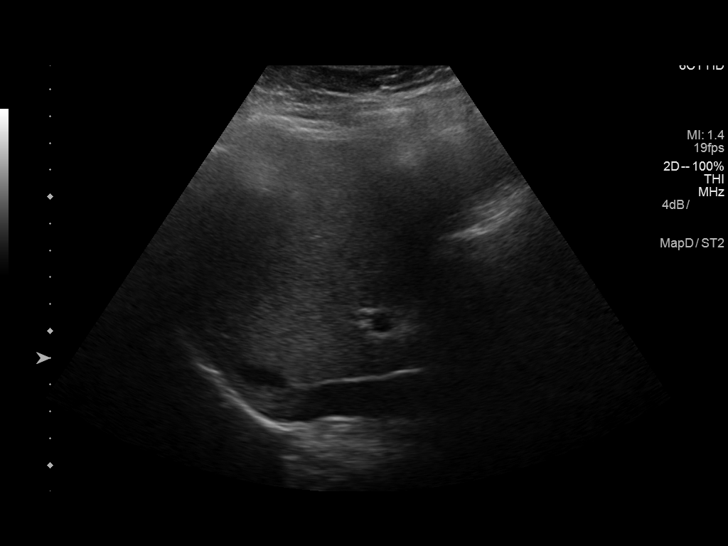
[im 35/35]
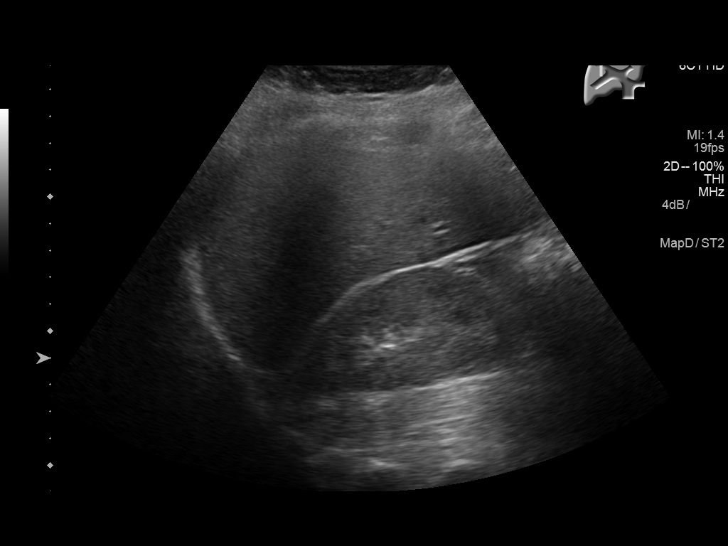

[14 of 25 positions shown; findings below may reference images not displayed]

FINDINGS: Gallbladder:

Surgically absent

Common bile duct:

Diameter: Up to 6 mm.  No shadowing filling defect.

Liver:

No focal lesion identified. Within normal limits in parenchymal
echogenicity. Antegrade flow in the imaged hepatic and portal venous
system.
IMPRESSION: Negative right upper quadrant ultrasound after cholecystectomy.

## 2021-07-08 ENCOUNTER — Other Ambulatory Visit: Payer: Self-pay | Admitting: *Deleted

## 2021-07-08 ENCOUNTER — Encounter: Payer: Self-pay | Admitting: Urology

## 2021-07-08 ENCOUNTER — Other Ambulatory Visit
Admission: RE | Admit: 2021-07-08 | Discharge: 2021-07-08 | Disposition: A | Discharge status: Home / Self Care | Payer: Medicare HMO | Attending: Urology | Admitting: Urology

## 2021-07-08 ENCOUNTER — Ambulatory Visit: Payer: Medicare HMO | Admitting: Urology

## 2021-07-08 ENCOUNTER — Other Ambulatory Visit: Payer: Self-pay

## 2021-07-08 VITALS — BP 146/84 | HR 86 | Ht 62.0 in | Wt 183.0 lb

## 2021-07-08 DIAGNOSIS — N3281 Overactive bladder: Secondary | ICD-10-CM | POA: Diagnosis present

## 2021-07-08 LAB — URINALYSIS, COMPLETE (UACMP) WITH MICROSCOPIC
Bilirubin Urine: NEGATIVE
Glucose, UA: NEGATIVE mg/dL
Hgb urine dipstick: NEGATIVE
Ketones, ur: NEGATIVE mg/dL
Leukocytes,Ua: NEGATIVE
Nitrite: NEGATIVE
Protein, ur: NEGATIVE mg/dL
Specific Gravity, Urine: 1.02 (ref 1.005–1.030)
pH: 5.5 (ref 5.0–8.0)

## 2021-07-08 LAB — BLADDER SCAN AMB NON-IMAGING

## 2021-07-08 NOTE — Progress Notes (Signed)
07/08/21 3:02 PM   Sandra Livingston 01/23/1956 [id number]  CC: Urinary frequency, nocturia  HPI: 66 year old female referred for the above issues.  Over the last 3 to 4 months she reports increased frequency of urination during the day and night, which also correlates with significantly increasing her fluid intake to 64 ounces plus.  When she cuts back fluids to 32 ounces a day she does well with 6-8 voids during the day, no significant nocturia.  She occasionally will have some urgency, but the primary issue is nocturia with high fluid intake.  She denies any lower extremity swelling.  She has never been evaluated for sleep apnea.  She denies any dysuria or gross hematuria.  She drinks coffee in the morning then mostly water during the day with some occasional diet tea.  Urinalysis today with 6-10 squamous cells, otherwise benign, PVR normal at 0 mL.   PMH: Past Medical History:  Diagnosis Date   Carpal tunnel syndrome    Chlamydia    Diabetes mellitus without complication ([place])    Eczema    Gall stones    Genital herpes    GERD (gastroesophageal reflux disease)    High grade dysplasia of Barrett's epithelium    Hyperlipidemia    Hypertension    IBS (irritable bowel syndrome)    Trichimoniasis    Vitamin D deficiency    Vitiligo     Surgical History: Past Surgical History:  Procedure Laterality Date   ABDOMINAL HYSTERECTOMY     BREAST SURGERY     CHOLECYSTECTOMY     COLONOSCOPY WITH PROPOFOL N/A 09/23/2015   Procedure: COLONOSCOPY WITH PROPOFOL;  Surgeon: Manya Silvas, MD;  Location: [facility];  Service: Endoscopy;  Laterality: N/A;   TONSILLECTOMY      Social History:  reports that she has never smoked. She has never used smokeless tobacco. She reports that she does not drink alcohol and does not use drugs.  Physical Exam: BP (!) 146/84 (BP Location: Left Arm, Patient Position: Sitting, Cuff Size: Large)    Pulse 86    Ht 5\' 2"  (1.575 m)    Wt 183 lb (83 kg)     BMI 33.47 kg/m    Constitutional:  Alert and oriented, No acute distress. Cardiovascular: No clubbing, cyanosis, or edema. Respiratory: Normal respiratory effort, no increased work of breathing. GI: Abdomen is soft, nontender, nondistended, no abdominal masses  Assessment & Plan:   66 year old female with mild urinary symptoms of frequency and some nocturia associated with increased fluid intake.  Normal urinalysis, PVR 0 female.  We discussed that overactive bladder (OAB) is not a disease, but is a symptom complex that is generally not life-threatening.  Symptoms typically include urinary urgency, frequency, and urge incontinence.  There are numerous treatment options, however there are risks and benefits with both medical and surgical management.  First-line treatment is behavioral therapies including bladder training, pelvic floor muscle training, and fluid management.  Second line treatments include oral antimuscarinics(Ditropan er, Trospium) and beta-3 agonist (Mybetriq). There is typically a period of medication trial (4-8 weeks) to find the optimal therapy and dosing. If symptoms are bothersome despite the above management, third line options include intra-detrusor botox, peripheral tibial nerve stimulation (PTNS), and interstim (SNS). These are more invasive treatments with higher side effect profile, but may improve quality of life for patients with severe OAB symptoms.   I recommended starting with behavioral strategies of fluid modification.  She would like to avoid medications at this time  which is very reasonable.  Return precautions discussed and can follow-up with urology as needed   Nickolas Madrid, MD 07/08/2021  [facility] [address] [place], [place] [address] [phone]

## 2021-07-08 NOTE — Patient Instructions (Signed)
Overactive Bladder, Adult °Overactive bladder is a condition in which a person has a sudden and frequent need to urinate. A person might also leak urine if he or she cannot get to the bathroom fast enough (urinary incontinence). Sometimes, symptoms can interfere with work or social activities. °What are the causes? °Overactive bladder is associated with poor nerve signals between your bladder and your brain. Your bladder may get the signal to empty before it is full. You may also have very sensitive muscles that make your bladder squeeze too soon. This condition may also be caused by other factors, such as: °Medical conditions: °Urinary tract infection. °Infection of nearby tissues. °Prostate enlargement. °Bladder stones, inflammation, or tumors. °Diabetes. °Muscle or nerve weakness, especially from these conditions: °A spinal cord injury. °Stroke. °Multiple sclerosis. °Parkinson's disease. °Other causes: °Surgery on the uterus or urethra. °Drinking too much caffeine or alcohol. °Certain medicines, especially those that eliminate extra fluid in the body (diuretics). °Constipation. °What increases the risk? °You may be at greater risk for overactive bladder if you: °Are an older adult. °Smoke. °Are going through menopause. °Have prostate problems. °Have a neurological disease, such as stroke, dementia, Parkinson's disease, or multiple sclerosis (MS). °Eat or drink alcohol, spicy food, caffeine, and other things that irritate the bladder. °Are overweight or obese. °What are the signs or symptoms? °Symptoms of this condition include a sudden, strong urge to urinate. Other symptoms include: °Leaking urine. °Urinating 8 or more times a day. °Waking up to urinate 2 or more times overnight. °How is this diagnosed? °This condition may be diagnosed based on: °Your symptoms and medical history. °A physical exam. °Blood or urine tests to check for possible causes, such as infection. °You may also need to see a health care  provider who specializes in urinary tract problems. This is called a urologist. °How is this treated? °Treatment for overactive bladder depends on the cause of your condition and whether it is mild or severe. Treatment may include: °Bladder training, such as: °Learning to control the urge to urinate by following a schedule to urinate at regular intervals. °Doing Kegel exercises to strengthen the pelvic floor muscles that support your bladder. °Special devices, such as: °Biofeedback. This uses sensors to help you become aware of your body's signals. °Electrical stimulation. This uses electrodes placed inside the body (implanted) or outside the body. These electrodes send gentle pulses of electricity to strengthen the nerves or muscles that control the bladder. °Women may use a plastic device, called a pessary, that fits into the vagina and supports the bladder. °Medicines, such as: °Antibiotics to treat bladder infection. °Antispasmodics to stop the bladder from releasing urine at the wrong time. °Tricyclic antidepressants to relax bladder muscles. °Injections of botulinum toxin type A directly into the bladder tissue to relax bladder muscles. °Surgery, such as: °A device may be implanted to help manage the nerve signals that control urination. °An electrode may be implanted to stimulate electrical signals in the bladder. °A procedure may be done to change the shape of the bladder. This is done only in very severe cases. °Follow these instructions at home: °Eating and drinking ° °Make diet or lifestyle changes recommended by your health care provider. These may include: °Drinking fluids throughout the day and not only with meals. °Cutting down on caffeine or alcohol. °Eating a healthy and balanced diet to prevent constipation. This may include: °Choosing foods that are high in fiber, such as beans, whole grains, and fresh fruits and vegetables. °Limiting foods that are   high in fat and processed sugars, such as fried  and sweet foods. Lifestyle  Lose weight if needed. Do not use any products that contain nicotine or tobacco. These include cigarettes, chewing tobacco, and vaping devices, such as e-cigarettes. If you need help quitting, ask your health care provider. General instructions Take over-the-counter and prescription medicines only as told by your health care provider. If you were prescribed an antibiotic medicine, take it as told by your health care provider. Do not stop taking the antibiotic even if you start to feel better. Use any implants or pessary as told by your health care provider. If needed, wear pads to absorb urine leakage. Keep a log to track how much and when you drink, and when you need to urinate. This will help your health care provider monitor your condition. Keep all follow-up visits. This is important. Contact a health care provider if: You have a fever or chills. Your symptoms do not get better with treatment. Your pain and discomfort get worse. You have more frequent urges to urinate. Get help right away if: You are not able to control your bladder. Summary Overactive bladder refers to a condition in which a person has a sudden and frequent need to urinate. Several conditions may lead to an overactive bladder. Treatment for overactive bladder depends on the cause and severity of your condition. Making lifestyle changes, doing Kegel exercises, keeping a log, and taking medicines can help with this condition. This information is not intended to replace advice given to you by your health care provider. Make sure you discuss any questions you have with your health care provider. Document Revised: 02/05/2020 Document Reviewed: 02/05/2020 [employer] Patient Education  [place].   Kegel Exercises Kegel exercises can help strengthen your pelvic floor muscles. The pelvic floor is a group of muscles that support your rectum, small intestine, and bladder. In females, pelvic  floor muscles also help support the uterus. These muscles help you control the flow of urine and stool (feces). Kegel exercises are painless and simple. They do not require any equipment. Your provider may suggest Kegel exercises to: Improve bladder and bowel control. Improve sexual response. Improve weak pelvic floor muscles after surgery to remove the uterus (hysterectomy) or after pregnancy, in females. Improve weak pelvic floor muscles after prostate gland removal or surgery, in males. Kegel exercises involve squeezing your pelvic floor muscles. These are the same muscles you squeeze when you try to stop the flow of urine or keep from passing gas. The exercises can be done while sitting, standing, or lying down, but it is best to vary your position. Ask your health care provider which exercises are safe for you. Do exercises exactly as told by your health care provider and adjust them as directed. Do not begin these exercises until told by your health care provider. Exercises How to do Kegel exercises: Squeeze your pelvic floor muscles tight. You should feel a tight lift in your rectal area. If you are a female, you should also feel a tightness in your vaginal area. Keep your stomach, buttocks, and legs relaxed. Hold the muscles tight for up to 10 seconds. Breathe normally. Relax your muscles for up to 10 seconds. Repeat as told by your health care provider. Repeat this exercise daily as told by your health care provider. Continue to do this exercise for at least 4-6 weeks, or for as long as told by your health care provider. You may be referred to a physical therapist who  can help you learn more about how to do Kegel exercises. Depending on your condition, your health care provider may recommend: Varying how long you squeeze your muscles. Doing several sets of exercises every day. Doing exercises for several weeks. Making Kegel exercises a part of your regular exercise routine. This  information is not intended to replace advice given to you by your health care provider. Make sure you discuss any questions you have with your health care provider. Document Revised: 09/26/2020 Document Reviewed: 09/26/2020 [employer] Patient Education  2022 [employer].
